# Patient Record
Sex: Male | Born: 2000 | Race: White | Hispanic: Yes | Marital: Single | Smoking: Never smoker
Health system: Southern US, Community
[De-identification: ages and names within clinical notes are randomized; demographics above are authoritative.]

---
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
## 2021-02-09 IMAGING — CT CT OF THE LEFT FOOT WITHOUT CONTRAST
3 of 6 series · 10 of 36 positions shown, 11 images · non-contrast
Comparison: None.

CLINICAL DATA: Left medial foot pain worsening since childhood. No
known injury.

EXAM:
CT OF THE LEFT FOOT WITHOUT CONTRAST
TECHNIQUE: Multidetector CT imaging of the left foot was performed according to
the standard protocol. Multiplanar CT image reconstructions were
also generated.

[Series 2: axial bone sfov lower extremity · axial · 0.24mm/px · z∈[+787,+787]mm · 1 of 186 slices shown, 2 images]
[im 93/186  soft-tissue]
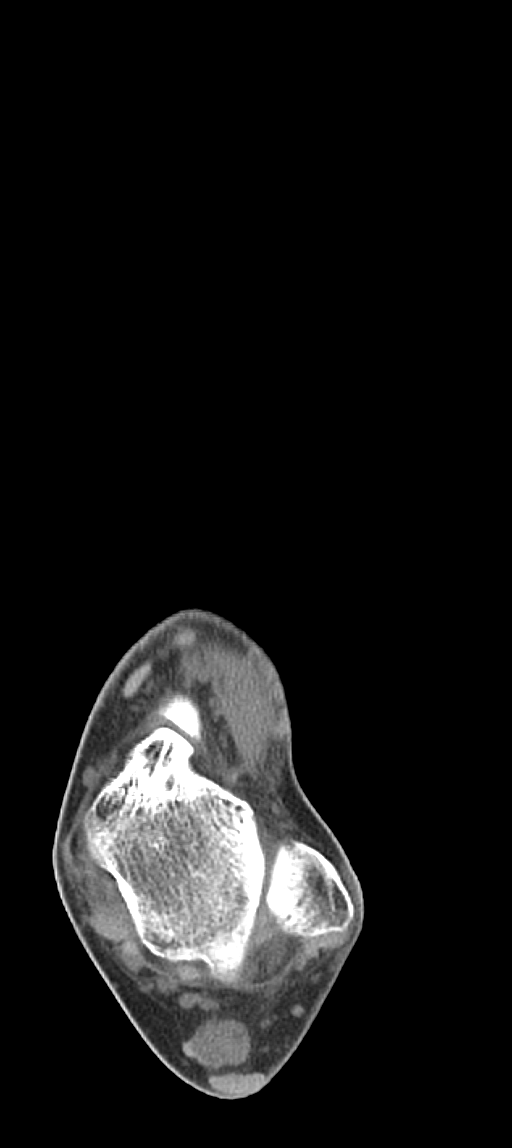
[im 93/186  bone]
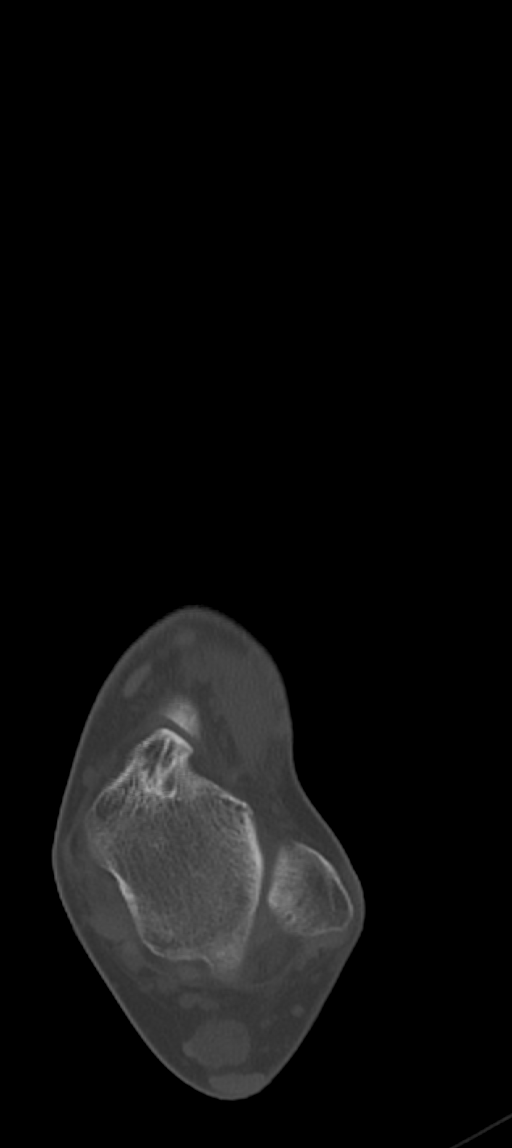

[Series 8: coronal bone sfov lower extremity · coronal · 0.24mm/px · 3 of 341 slices shown]
[im 110/341  bone]
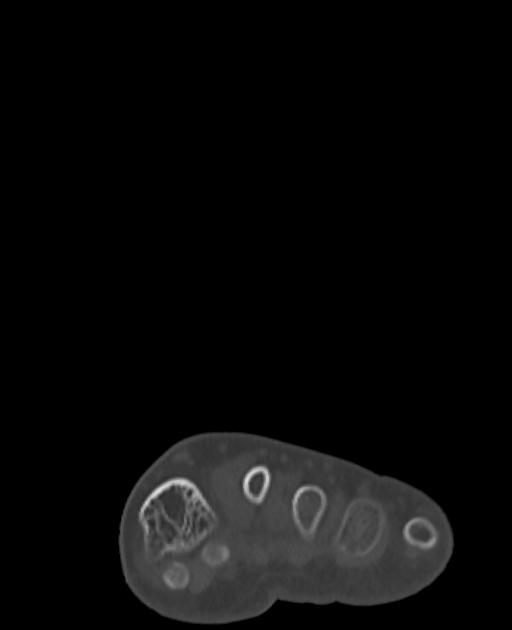
[im 171/341  bone]
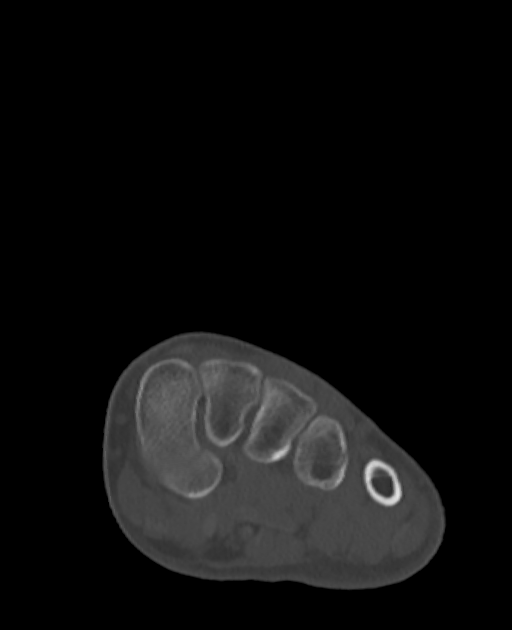
[im 232/341  bone]
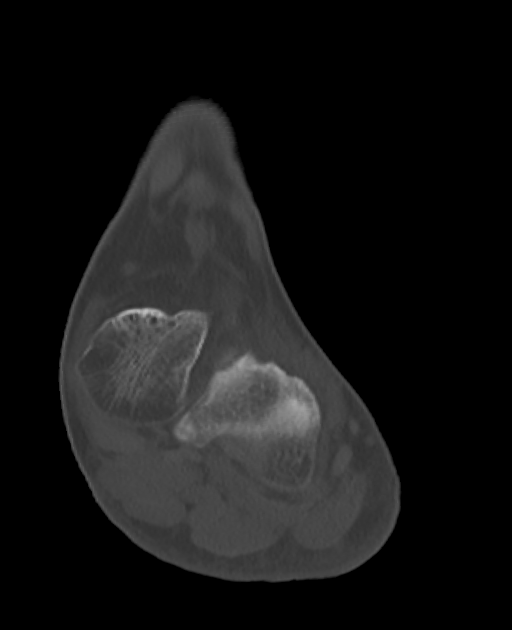

[Series 12: sagittal bone sfov lower extremity · sagittal · 0.29mm/px · 6 of 148 slices shown]
[im 25/148  bone]
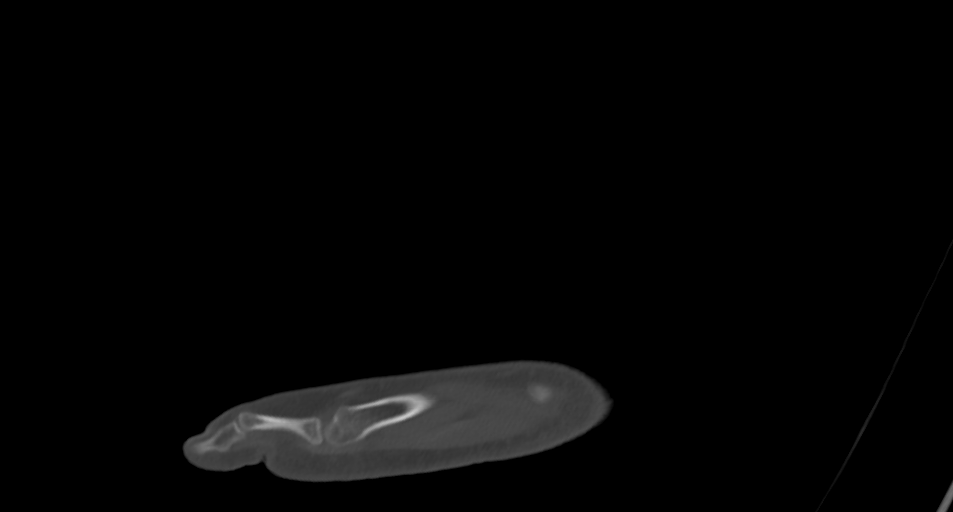
[im 26/148  soft-tissue]
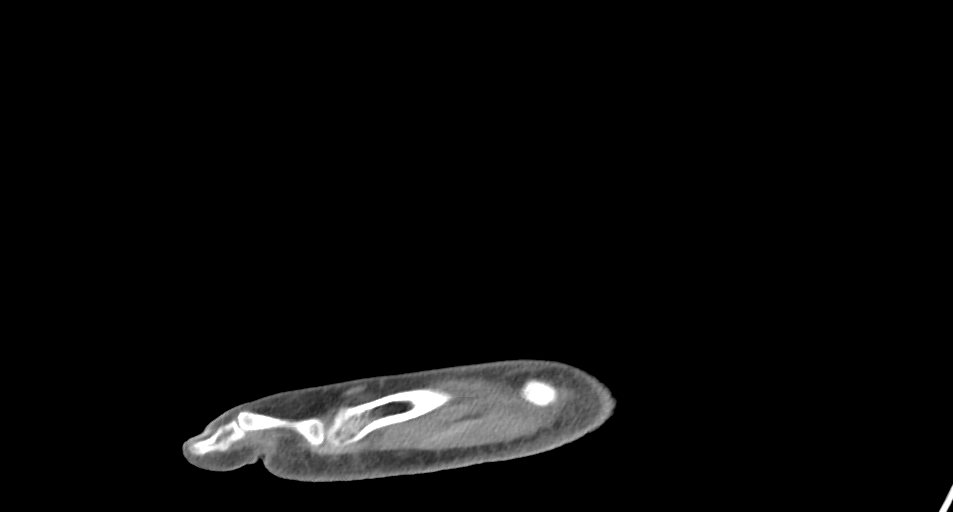
[im 50/148  bone]
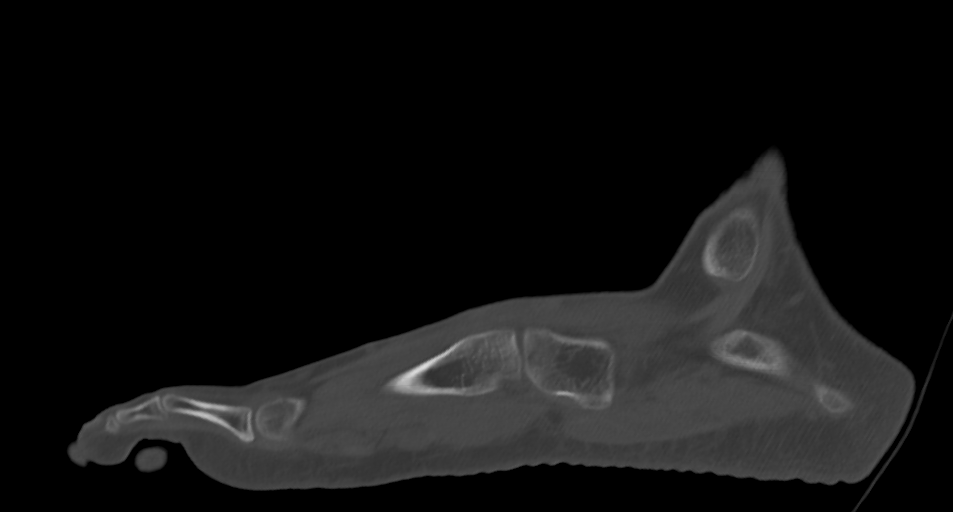
[im 74/148  bone]
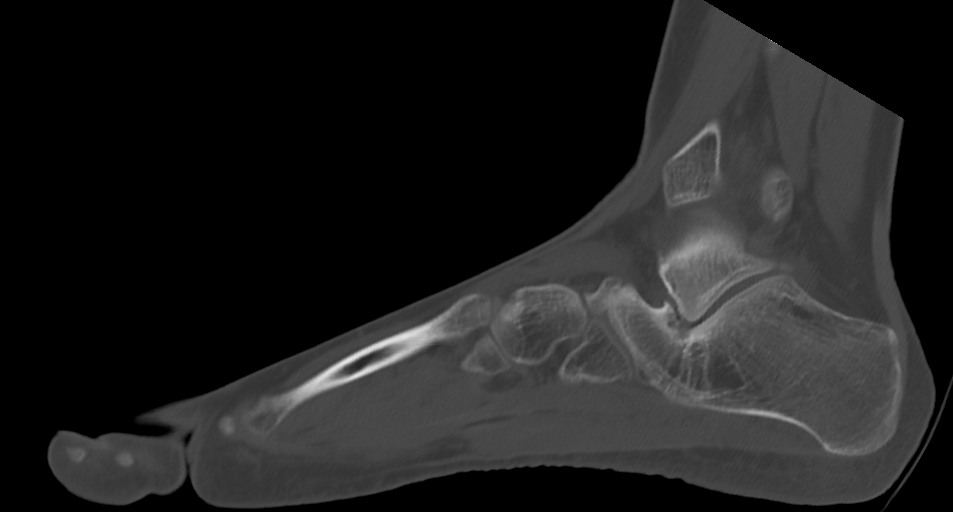
[im 99/148  bone]
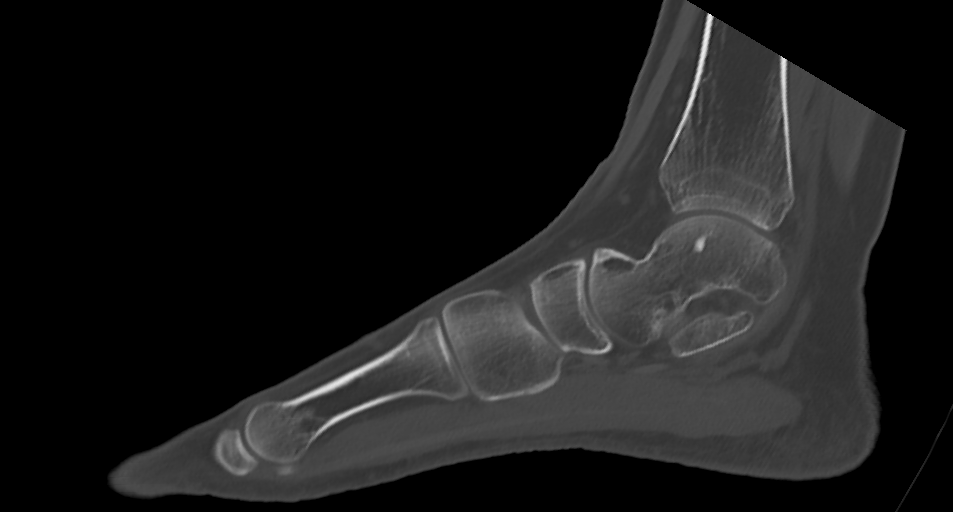
[im 123/148  bone]
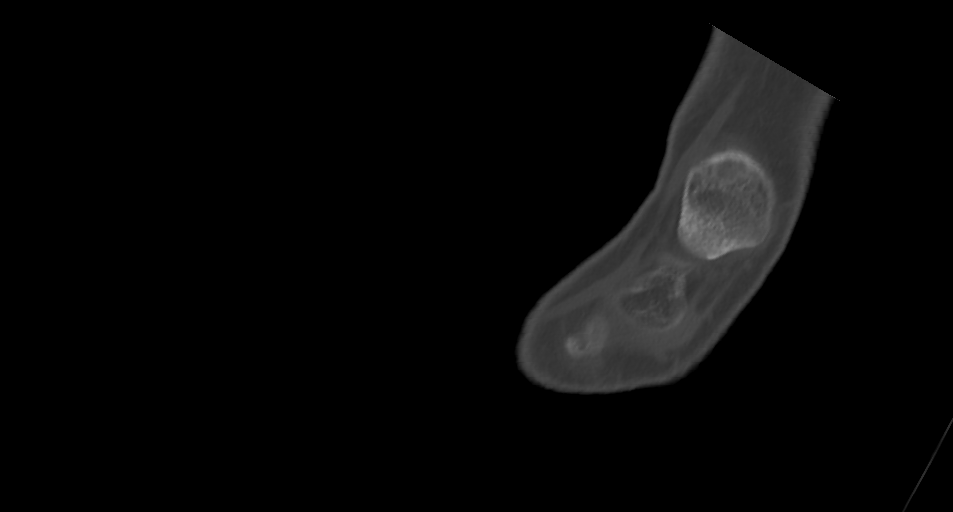

[10 of 36 positions shown; findings below may reference images not displayed]

FINDINGS: Bones/Joint/Cartilage

No fracture or dislocation. Normal alignment. No joint effusion. No
periosteal reaction or bone destruction. Ankle mortise is normal.

Abnormal broad articulation with osseous bridging involving the
anterior process of the calcaneus and lateral portion of the
navicular consistent with calcaneonavicular coalition.

Subtalar joints are normal. Remainder the joint spaces throughout
the foot are maintained.

Ligaments

Ligaments are suboptimally evaluated by CT.

Muscles and Tendons
Muscles are normal. No muscle atrophy. No intramuscular fluid
collection or hematoma. Flexor, extensor, peroneal and Achilles
tendons are grossly intact.

Soft tissue
No fluid collection or hematoma.  No soft tissue mass.
IMPRESSION: 1. No acute osseous injury of the left foot.
2. Osseous calcaneonavicular coalition.

## 2022-08-20 ENCOUNTER — Ambulatory Visit: Payer: Self-pay | Admitting: Podiatry

## 2022-08-30 ENCOUNTER — Encounter: Payer: Self-pay | Admitting: Podiatry

## 2022-08-30 ENCOUNTER — Ambulatory Visit: Payer: Medicaid Other | Admitting: Podiatry

## 2022-08-30 DIAGNOSIS — B351 Tinea unguium: Secondary | ICD-10-CM

## 2022-08-30 MED ORDER — TERBINAFINE HCL 250 MG PO TABS: 250.0000 mg | ORAL_TABLET | Freq: Every day | ORAL | 0 refills | Status: DC

## 2022-08-30 NOTE — Progress Notes (Unsigned)
   Chief Complaint  Patient presents with   Nail Problem    Patient is here for left foot Great toe nail fungus.    Subjective: 21 y.o. male presenting today as a reestablish new patient for evaluation of discoloration with thickening to the left hallux nail plate that has been ongoing for about 1 year now.  Gradual onset.  He denies a history of injury.  He has not done anything for treatment  No past medical history on file.  No past surgical history on file.  No Known Allergies  Objective: Physical Exam General: The patient is alert and oriented x3 in no acute distress.  Dermatology: Hyperkeratotic, discolored, thickened, onychodystrophy noted left hallux nail plate. Skin is warm, dry and supple bilateral lower extremities. Negative for open lesions or macerations.  Vascular: Palpable pedal pulses bilaterally. No edema or erythema noted. Capillary refill within normal limits.  Neurological: Epicritic and protective threshold grossly intact bilaterally.   Musculoskeletal Exam: No pedal deformity noted  Assessment: #1 Onychomycosis of toenail left hallux nail plate  Plan of Care:  #1 Patient was evaluated. #2  Today we discussed different treatment options including oral, topical, and laser antifungal treatment modalities.  We discussed their efficacies and side effects.  Patient opts for oral antifungal treatment modality #3 prescription for Lamisil 250 mg #90 daily. Pt denies a history of liver pathology or symptoms.  Patient is otherwise healthy #4  DC Tolcylen antifungal topical dispensed at checkout  #5 return to clinic 6 months   Edrick Kins, DPM [facility]  Dr. Edrick Kins, DPM    [address], [place] [address]                Office [phone]  Fax [phone]

## 2023-03-04 ENCOUNTER — Encounter: Payer: Self-pay | Admitting: Podiatry

## 2023-03-04 ENCOUNTER — Ambulatory Visit (INDEPENDENT_AMBULATORY_CARE_PROVIDER_SITE_OTHER): Payer: Medicaid Other | Admitting: Podiatry

## 2023-03-04 VITALS — BP 127/72 | HR 60

## 2023-03-04 DIAGNOSIS — B351 Tinea unguium: Secondary | ICD-10-CM

## 2023-03-04 MED ORDER — TERBINAFINE HCL 250 MG PO TABS: 250.0000 mg | ORAL_TABLET | Freq: Every day | ORAL | 0 refills | Status: AC

## 2023-03-04 NOTE — Progress Notes (Signed)
   Chief Complaint  Patient presents with   Nail Problem    "I think it has improved a little bit but not by much."    Subjective: 22 y.o. male presenting today for follow-up evaluation of discoloration with fungal nail infection to the left hallux nail plate this been ongoing for about 1 year now.  He completed 3 months of oral Lamisil with was initiated last visit 08/30/2022.  No complications with oral Lamisil.  No change in medical status  No past medical history on file.  No past surgical history on file.  No Known Allergies   Bilateral toes 03/04/2023.  Postdebridement  Objective: Physical Exam General: The patient is alert and oriented x3 in no acute distress.  Dermatology: Hyperkeratotic, discolored, thickened, onychodystrophy noted left hallux nail plate.  The base of the nail plate actually appears more healthy and viable and it appears that the fungal nail is growing out somewhat.  Vascular: Palpable pedal pulses bilaterally. No edema or erythema noted. Capillary refill within normal limits.  Neurological: Epicritic and protective threshold grossly intact bilaterally.   Musculoskeletal Exam: No pedal deformity noted  Assessment: #1  Fungal nail infection left hallux nail plate #2 due to onychomycosis of toenails both  Plan of Care:  -Patient evaluated -Mechanical debridement of nails 1-5 bilateral was performed using a nail nipper without incident or bleeding -The patient tolerated the oral Lamisil fine.  No change in medical history.  Refill prescription for Lamisil 250 mg #90 daily.  Okay for refill in 90 days -Return to clinic 6 months for follow-up   Felecia Shelling, DPM [facility]  Dr. Felecia Shelling, DPM    [address] [place], [place] [address]                Office [phone]  Fax [phone]

## 2023-09-05 ENCOUNTER — Ambulatory Visit (INDEPENDENT_AMBULATORY_CARE_PROVIDER_SITE_OTHER): Payer: Medicaid Other | Admitting: Podiatry

## 2023-09-05 DIAGNOSIS — M79674 Pain in right toe(s): Secondary | ICD-10-CM

## 2023-09-05 DIAGNOSIS — M79675 Pain in left toe(s): Secondary | ICD-10-CM

## 2023-09-05 DIAGNOSIS — B351 Tinea unguium: Secondary | ICD-10-CM | POA: Diagnosis not present

## 2023-09-05 NOTE — Progress Notes (Signed)
   No chief complaint on file.   Subjective: 22 y.o. male presenting today for follow-up evaluation of discoloration with fungal nail infection to the left hallux nail plate.  He has now completed 6 months of oral Lamisil with no improvement.  Presenting for further treatment evaluation  No past medical history on file.  No past surgical history on file.  No Known Allergies   Bilateral toes 03/04/2023.  Postdebridement  Objective: Physical Exam General: The patient is alert and oriented x3 in no acute distress.  Dermatology: Hyperkeratotic, discolored, thickened, onychodystrophy noted left hallux nail plate.  The base of the nail plate actually appears more healthy and viable and it appears that the fungal nail is growing out somewhat.  Vascular: Palpable pedal pulses bilaterally. No edema or erythema noted. Capillary refill within normal limits.  Neurological: Grossly intact via light touch  Musculoskeletal Exam: No pedal deformity noted  Assessment: #1  Fungal nail infection left hallux nail plate #2 due to onychomycosis of toenails both  Plan of Care:  -Patient evaluated - Unfortunately there was not significant improvement with 6 months of the oral Lamisil.  No refill prescription today -Mechanical debridement of the left hallux nail plate was performed today -OTC Tolcylen antifungal topical dispensed at checkout.  Apply daily -Return to clinic as needed   Felecia Shelling, DPM [facility]  Dr. Felecia Shelling, DPM    [address] [place], [place] [address]                Office [phone]  Fax [phone]
